# Patient Record
Sex: Male | Born: 1990 | Race: Black or African American | Hispanic: No | Marital: Single | Smoking: Never smoker
Health system: Southern US, Community
[De-identification: ages and names within clinical notes are randomized; demographics above are authoritative.]

---

## 2010-09-30 ENCOUNTER — Emergency Department (HOSPITAL_COMMUNITY)
Admission: EM | Admit: 2010-09-30 | Discharge: 2010-10-01 | Discharge status: Against Medical Advice | Payer: Self-pay | Attending: Emergency Medicine | Admitting: Emergency Medicine

## 2010-09-30 DIAGNOSIS — H5710 Ocular pain, unspecified eye: Secondary | ICD-10-CM | POA: Insufficient documentation

## 2011-04-18 ENCOUNTER — Encounter (HOSPITAL_COMMUNITY): Payer: Self-pay

## 2011-04-18 ENCOUNTER — Emergency Department (HOSPITAL_COMMUNITY)
Admission: EM | Admit: 2011-04-18 | Discharge: 2011-04-19 | Disposition: A | Discharge status: Home / Self Care | Payer: Self-pay | Attending: Emergency Medicine | Admitting: Emergency Medicine

## 2011-04-18 DIAGNOSIS — R944 Abnormal results of kidney function studies: Secondary | ICD-10-CM | POA: Insufficient documentation

## 2011-04-18 DIAGNOSIS — R7989 Other specified abnormal findings of blood chemistry: Secondary | ICD-10-CM

## 2011-04-18 DIAGNOSIS — K0889 Other specified disorders of teeth and supporting structures: Secondary | ICD-10-CM

## 2011-04-18 DIAGNOSIS — K089 Disorder of teeth and supporting structures, unspecified: Secondary | ICD-10-CM | POA: Insufficient documentation

## 2011-04-18 NOTE — ED Notes (Signed)
Pt complains of a toothache and states that he took 5 ibuprofens 200mg  at a time every hour between 6-8 while at work

## 2011-04-19 LAB — POCT I-STAT, CHEM 8
Calcium, Ion: 1.2 mmol/L (ref 1.12–1.32)
HCT: 41 % (ref 39.0–52.0)
Sodium: 141 mEq/L (ref 135–145)
TCO2: 25 mmol/L (ref 0–100)

## 2011-04-19 MED ORDER — HYDROCODONE-ACETAMINOPHEN 5-500 MG PO TABS: 1.0000 | ORAL_TABLET | Freq: Four times a day (QID) | ORAL | Status: AC | PRN

## 2011-04-19 MED ORDER — OXYCODONE-ACETAMINOPHEN 5-325 MG PO TABS
2.0000 | ORAL_TABLET | Freq: Once | ORAL | Status: AC
  Administered 2011-04-19: 2 via ORAL
  Filled 2011-04-19: qty 2

## 2011-04-19 NOTE — Discharge Instructions (Signed)
STOP any ibuprofen, motrin or any other NSAID use for the next month. Follow up with [facility] in the next week for recheck of creatinine. Follow up with dentist tomorrow is VERY important for further evaluation and management of dental pain.   Dental Pain A tooth ache may be caused by cavities (tooth decay). Cavities expose the nerve of the tooth to air and hot or cold temperatures. It may come from an infection or abscess (also called a boil or furuncle) around your tooth. It is also often caused by dental caries (tooth decay). This causes the pain you are having. DIAGNOSIS  Your caregiver can diagnose this problem by exam. TREATMENT   If caused by an infection, it may be treated with medications which kill germs (antibiotics) and pain medications as prescribed by your caregiver. Take medications as directed.   Only take over-the-counter or prescription medicines for pain, discomfort, or fever as directed by your caregiver.   Whether the tooth ache today is caused by infection or dental disease, you should see your dentist as soon as possible for further care.  SEEK MEDICAL CARE IF: The exam and treatment you received today has been provided on an emergency basis only. This is not a substitute for complete medical or dental care. If your problem worsens or new problems (symptoms) appear, and you are unable to meet with your dentist, call or return to this location. SEEK IMMEDIATE MEDICAL CARE IF:   You have a fever.   You develop redness and swelling of your face, jaw, or neck.   You are unable to open your mouth.   You have severe pain uncontrolled by pain medicine.  MAKE SURE YOU:   Understand these instructions.   Will watch your condition.   Will get help right away if you are not doing well or get worse.  Document Released: 12/27/2004 Document Revised: 12/16/2010 Document Reviewed: 08/15/2007 [facility] Patient Information 2012 [place], [place].

## 2011-04-19 NOTE — ED Notes (Signed)
Pt refusing to have his labs drawn at this time. Bethany PA aware

## 2011-04-19 NOTE — ED Provider Notes (Signed)
Medical screening examination/treatment/procedure(s) were performed by non-physician practitioner and as supervising physician I was immediately available for consultation/collaboration.  Olivia Mackie, MD 04/19/11 [phone]

## 2011-04-19 NOTE — ED Provider Notes (Signed)
History     CSN: [id number]  Arrival date & time 04/18/11  2315   First MD Initiated Contact with Patient 04/18/11 2353      Chief Complaint  Patient presents with  . Dental Pain    (Consider location/radiation/quality/duration/timing/severity/associated sxs/prior treatment) HPI  Patient presents to ER with complaint of right lower dental pain and question of ibuprofen OD. Patient states that for 4 days he has had right lower dental pain that was gradual onset and intermittent, initially improving greatly after ibuprofen use but then increasing pain and need for increased doses of ibuprofen for pain control. Patient states he took 5 ibuprofen at a time to total 1 gram and did that approximately 3 days in a row. He states the he called and got an appointment with [facility] for tomorrow but that staff was concerned with the amount of ibuprofen that he reported to them that he was taking. Denies abdominal pain, n/v/d, or changes in urination. Patient states he has no known medical problems and takes no meds on regular basis. Patient states dental pain is aggravated by chewing and cold air. Denies fevers, chills, facial swelling, difficulty breathing or difficulty swallowing.   History reviewed. No pertinent past medical history.  History reviewed. No pertinent past surgical history.  History reviewed. No pertinent family history.  History  Substance Use Topics  . Smoking status: Not on file  . Smokeless tobacco: Not on file  . Alcohol Use: No      Review of Systems  All other systems reviewed and are negative.    Allergies  Review of patient's allergies indicates no known allergies.  Home Medications   Current Outpatient Rx  Name Route Sig Dispense Refill  . IBUPROFEN 200 MG PO TABS Oral Take 1,000 mg by mouth every 6 (six) hours as needed. Pain      BP 149/78  Pulse 61  Temp(Src) 98.7 F (37.1 C) (Oral)  Resp 20  SpO2 100%  Physical Exam  Vitals  reviewed. Constitutional: He is oriented to person, place, and time. He appears well-developed and well-nourished. No distress.  HENT:  Head: Normocephalic and atraumatic.  Right Ear: External ear normal.  Left Ear: External ear normal.  Nose: Nose normal.  Mouth/Throat: No oropharyngeal exudate.       Mild TTP of right lower 2nd molar without gingival swelling or fluctuance. Patent airway.   Eyes: Conjunctivae are normal.  Neck: Normal range of motion. Neck supple.  Cardiovascular: Normal rate, regular rhythm and normal heart sounds.  Exam reveals no gallop and no friction rub.   No murmur heard. Pulmonary/Chest: Effort normal and breath sounds normal. No respiratory distress. He has no wheezes. He has no rales. He exhibits no tenderness.  Abdominal: Soft. He exhibits no distension and no mass. There is no tenderness. There is no rebound and no guarding.  Lymphadenopathy:    He has no cervical adenopathy.  Neurological: He is alert and oriented to person, place, and time. He has normal reflexes.  Skin: Skin is warm and dry. No rash noted. He is not diaphoretic.  Psychiatric: He has a normal mood and affect.    ED Course  Procedures (including critical care time)  PO percocet.   Labs Reviewed - No data to display No results found.   1. Pain, dental   2. Elevated serum creatinine     Assessment and plan discussed with Dr. Norlene Campbell  MDM  Patient has dental follow up tomorrow. Discussed mildly  elevated creatinine with patient and appropriate NSAID use in the future but no NSAID use for 1-2 months with close PCP follow up in 1-2 weeks for creatinine recheck. Patient voices understanding and is agreeable to plan.         Jenness Corner, [place] 04/19/11 [phone]

## 2015-09-04 ENCOUNTER — Encounter (HOSPITAL_COMMUNITY): Payer: Self-pay | Admitting: Emergency Medicine

## 2015-09-04 ENCOUNTER — Emergency Department (HOSPITAL_COMMUNITY)
Admission: EM | Admit: 2015-09-04 | Discharge: 2015-09-04 | Disposition: A | Discharge status: Home / Self Care | Payer: Self-pay | Attending: Emergency Medicine | Admitting: Emergency Medicine

## 2015-09-04 ENCOUNTER — Emergency Department (HOSPITAL_COMMUNITY): Payer: Self-pay

## 2015-09-04 DIAGNOSIS — Y929 Unspecified place or not applicable: Secondary | ICD-10-CM | POA: Insufficient documentation

## 2015-09-04 DIAGNOSIS — S39012A Strain of muscle, fascia and tendon of lower back, initial encounter: Secondary | ICD-10-CM | POA: Insufficient documentation

## 2015-09-04 DIAGNOSIS — Y990 Civilian activity done for income or pay: Secondary | ICD-10-CM | POA: Insufficient documentation

## 2015-09-04 DIAGNOSIS — Y93F2 Activity, caregiving, lifting: Secondary | ICD-10-CM | POA: Insufficient documentation

## 2015-09-04 DIAGNOSIS — X500XXA Overexertion from strenuous movement or load, initial encounter: Secondary | ICD-10-CM | POA: Insufficient documentation

## 2015-09-04 MED ORDER — NAPROXEN 500 MG PO TABS: 500.0000 mg | ORAL_TABLET | Freq: Two times a day (BID) | ORAL | 0 refills | Status: DC

## 2015-09-04 MED ORDER — CYCLOBENZAPRINE HCL 10 MG PO TABS: 10.0000 mg | ORAL_TABLET | Freq: Two times a day (BID) | ORAL | 0 refills | Status: DC | PRN

## 2015-09-04 NOTE — ED Triage Notes (Signed)
Pt reports lower back pain that started on Friday while at work. Got worse after sleeping that night. Pain worse with sitting upright.

## 2015-09-04 NOTE — ED Provider Notes (Signed)
[facility]-EMERGENCY DEPT Provider Note   CSN: [id number] Arrival date & time: 09/04/15  4098     History   Chief Complaint Chief Complaint  Patient presents with  . Back Pain    HPI Clifford Villanueva is a 25 y.o. male.  HPI   25 year old male presents to the ED for evaluation of lower back pain. Patient states he started work at [employer] in May, approximate 4 months ago. After the first week of working there, patient admits that he was using inappropriate lifting techniques and hurt his low back. Since then he has had intermittent low back pain in which she describes a sharp sensation, radiating across his back, worsening with heavy lifting or with prolonged sitting. Despite using appropriate lifting techniques, stretching, ice and heat, his pain has gotten progressively more frequent. This morning he woke up with sharp pain to his low back. He did take a muscle relaxant from his girlfriend which provide some relief. States pain is currently minimal but he would like further evaluation. Patient otherwise denies having fever, chills, abdominal pain, dysuria, hematuria, bowel bladder incontinence, saddle anesthesia, or radicular leg pain. He is able to work. No other complaints.  History reviewed. No pertinent past medical history.  There are no active problems to display for this patient.   History reviewed. No pertinent surgical history.     Home Medications    Prior to Admission medications   Medication Sig Start Date End Date Taking? Authorizing Provider  ibuprofen (ADVIL,MOTRIN) 200 MG tablet Take 1,000 mg by mouth every 6 (six) hours as needed. Pain    Historical Provider, MD    Family History History reviewed. No pertinent family history.  Social History Social History  Substance Use Topics  . Smoking status: Not on file  . Smokeless tobacco: Not on file  . Alcohol use No     Allergies   Review of patient's allergies indicates no known allergies.   Review of  Systems Review of Systems  Constitutional: Negative for fever.  Musculoskeletal: Positive for back pain. Negative for gait problem.  Neurological: Negative for numbness.     Physical Exam Updated Vital Signs BP 136/87   Pulse 71   Temp 98.9 F (37.2 C) (Oral)   Resp 16   SpO2 100%   Physical Exam  Constitutional: He appears well-developed and well-nourished. No distress.  Healthy-appearing male, sitting in the chair in no acute discomfort.   HENT:  Head: Atraumatic.  Eyes: Conjunctivae are normal.  Neck: Normal range of motion. Neck supple.  Cardiovascular: Intact distal pulses.   Abdominal: Soft. There is no tenderness.  Musculoskeletal: He exhibits tenderness (Mild tenderness to lumbar spine at L5-S1 and lumbar paraspinal muscle on palpation with full range of motion, no crepitus or step-off, no overlying skin changes).  Neurological: He is alert.  Able to ambulate without difficulty. Patellar deep tendon reflex intact bilaterally, no foot drops  Skin: Capillary refill takes less than 2 seconds. No rash noted.  Psychiatric: He has a normal mood and affect.     ED Treatments / Results  Labs (all labs ordered are listed, but only abnormal results are displayed) Labs Reviewed - No data to display  EKG  EKG Interpretation None       Radiology Dg Lumbar Spine Complete  Result Date: 09/04/2015 CLINICAL DATA:  Low back pain for several days following heavy lifting, initial encounter EXAM: LUMBAR SPINE - COMPLETE 4+ VIEW COMPARISON:  None. FINDINGS: There is no evidence of lumbar  spine fracture. Alignment is normal. Intervertebral disc spaces are maintained. IMPRESSION: No acute abnormality noted. Electronically Signed   By: Alcide CleverMark  Lukens M.D.   On: 09/04/2015 10:12    Procedures Procedures (including critical care time)  Medications Ordered in ED Medications - No data to display   Initial Impression / Assessment and Plan / ED Course  I have reviewed the triage  vital signs and the nursing notes.  Pertinent labs & imaging results that were available during my care of the patient were reviewed by me and considered in my medical decision making (see chart for details).  Clinical Course    BP 136/87   Pulse 71   Temp 98.9 F (37.2 C) (Oral)   Resp 16   Ht 6\' 1"  (1.854 m)   Wt 101.6 kg   SpO2 100%   BMI 29.55 kg/m    Final Clinical Impressions(s) / ED Diagnoses   Final diagnoses:  None    New Prescriptions New Prescriptions   No medications on file   No red flag s/s of low back pain. Patient was counseled on back pain precautions and told to do activity as tolerated but do not lift, push, or pull heavy objects more than 10 pounds for the next week. Patient counseled to use ice or heat on back for no longer than 15 minutes every hour.   Patient prescribed muscle relaxer and counseled on proper use of muscle relaxant medication.  Patient prescribed nonnarcotic pain medicine  Patient urged to follow-up with PCP if pain does not improve with treatment and rest or if pain becomes recurrent. Urged to return with worsening severe pain, loss of bowel or bladder control, trouble walking.  The patient verbalizes understanding and agrees with the plan.     Fayrene HelperBowie Esme Durkin, PA-C 09/04/15 1032    Lavera Guiseana Duo Liu, MD 09/04/15 2016

## 2016-05-02 ENCOUNTER — Encounter (HOSPITAL_BASED_OUTPATIENT_CLINIC_OR_DEPARTMENT_OTHER): Payer: Self-pay | Admitting: Emergency Medicine

## 2016-05-02 DIAGNOSIS — R110 Nausea: Secondary | ICD-10-CM | POA: Insufficient documentation

## 2016-05-02 DIAGNOSIS — R42 Dizziness and giddiness: Secondary | ICD-10-CM | POA: Insufficient documentation

## 2016-05-02 DIAGNOSIS — Y92410 Unspecified street and highway as the place of occurrence of the external cause: Secondary | ICD-10-CM | POA: Insufficient documentation

## 2016-05-02 DIAGNOSIS — Y999 Unspecified external cause status: Secondary | ICD-10-CM | POA: Insufficient documentation

## 2016-05-02 DIAGNOSIS — Y9389 Activity, other specified: Secondary | ICD-10-CM | POA: Insufficient documentation

## 2016-05-02 DIAGNOSIS — R51 Headache: Secondary | ICD-10-CM | POA: Insufficient documentation
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
## 2016-05-03 ENCOUNTER — Emergency Department (HOSPITAL_BASED_OUTPATIENT_CLINIC_OR_DEPARTMENT_OTHER): Payer: Self-pay

## 2016-05-03 ENCOUNTER — Emergency Department (HOSPITAL_BASED_OUTPATIENT_CLINIC_OR_DEPARTMENT_OTHER)
Admission: EM | Admit: 2016-05-03 | Discharge: 2016-05-03 | Disposition: A | Discharge status: Home / Self Care | Payer: Self-pay | Attending: Emergency Medicine | Admitting: Emergency Medicine

## 2016-05-03 ENCOUNTER — Encounter (HOSPITAL_BASED_OUTPATIENT_CLINIC_OR_DEPARTMENT_OTHER): Payer: Self-pay | Admitting: Emergency Medicine

## 2016-05-03 MED ORDER — NAPROXEN 375 MG PO TABS: 375.0000 mg | ORAL_TABLET | Freq: Two times a day (BID) | ORAL | 0 refills | Status: DC

## 2016-05-03 MED ORDER — ACETAMINOPHEN 500 MG PO TABS
1000.0000 mg | ORAL_TABLET | Freq: Once | ORAL | Status: AC
  Administered 2016-05-03: 1000 mg via ORAL
  Filled 2016-05-03: qty 2

## 2016-05-03 MED ORDER — IBUPROFEN 800 MG PO TABS
800.0000 mg | ORAL_TABLET | Freq: Once | ORAL | Status: AC
  Administered 2016-05-03: 800 mg via ORAL
  Filled 2016-05-03: qty 1
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
## 2016-06-16 NOTE — ED Notes (Signed)
[employer] called, who wanted work note faxed from this encounter. This nurse called the patient and confirmed that this would be OK. Work note faxed.

## 2017-09-13 IMAGING — CR DG LUMBAR SPINE COMPLETE 4+V
5 series · 5 of 5 positions shown · non-contrast
Comparison: None.

CLINICAL DATA: Low back pain for several days following heavy
lifting, initial encounter

EXAM:
LUMBAR SPINE - COMPLETE 4+ VIEW

[t lumbar spine ap]
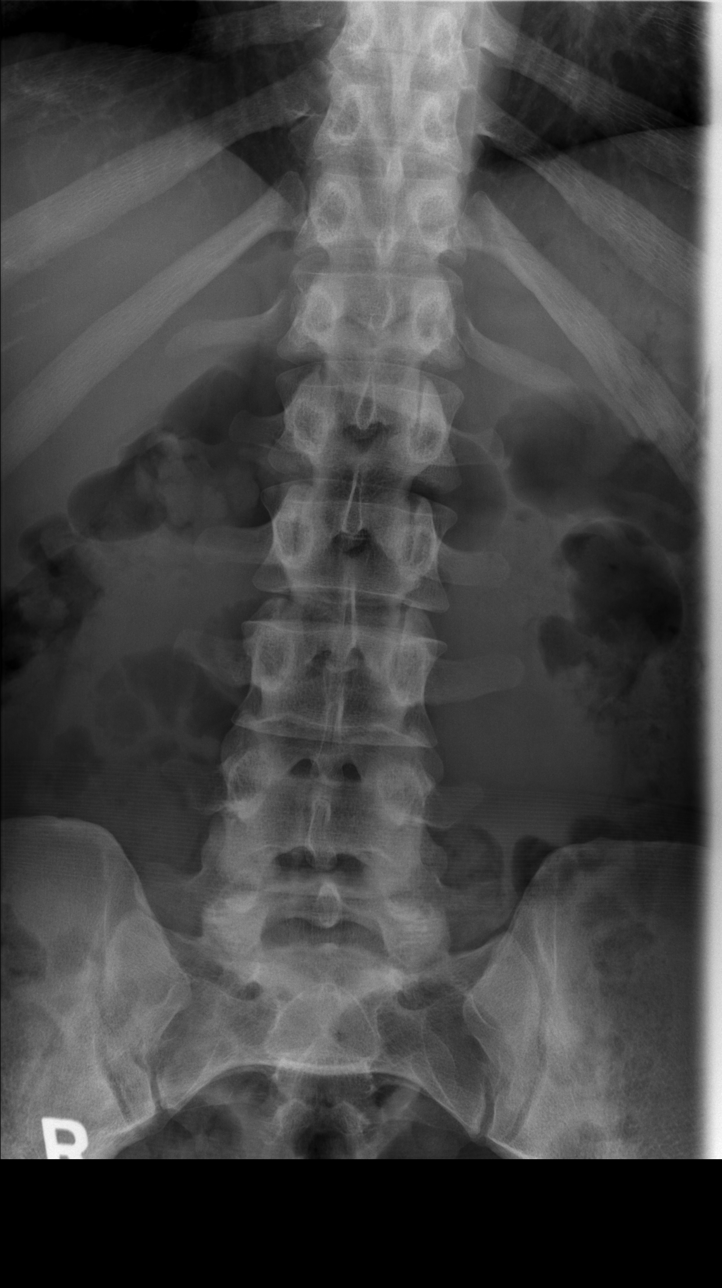

[t lumbar spine obl (1 of 2)]
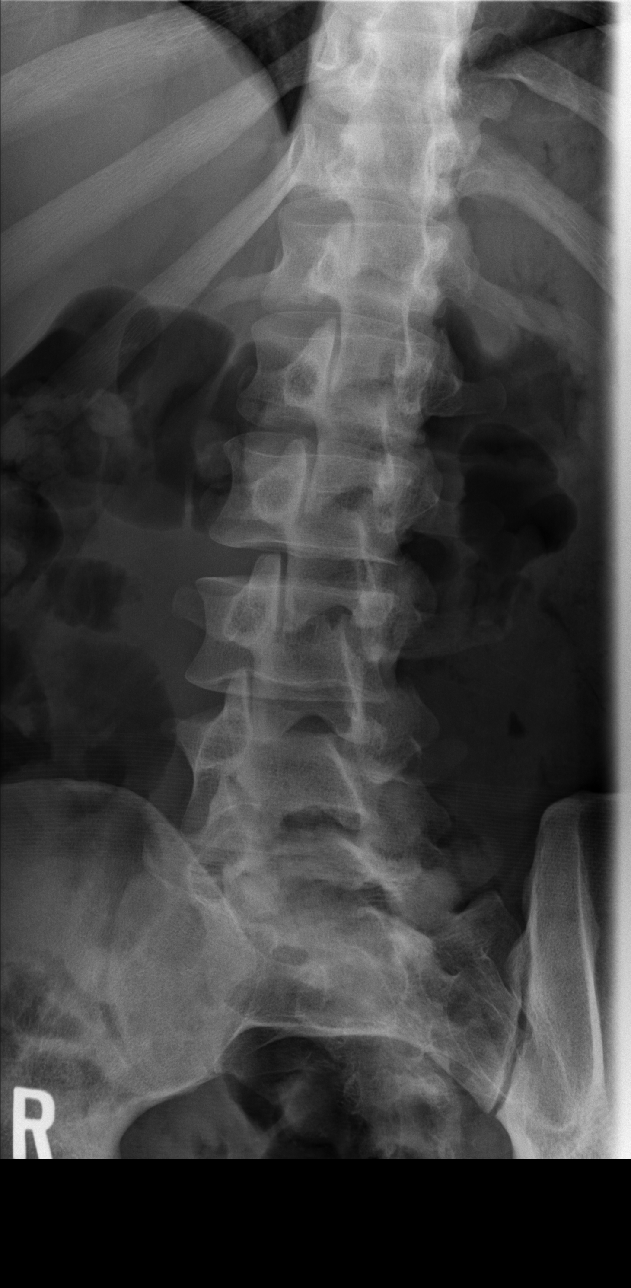

[t lumbar spine obl (2 of 2)]
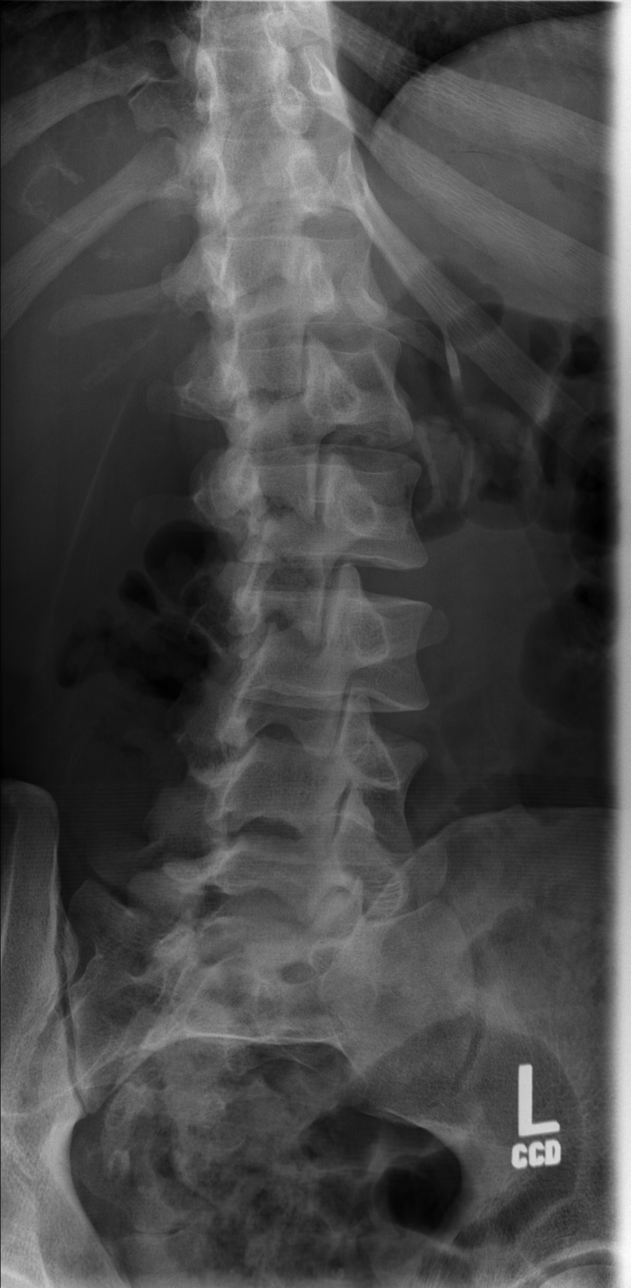

[t lumbar spine lat]
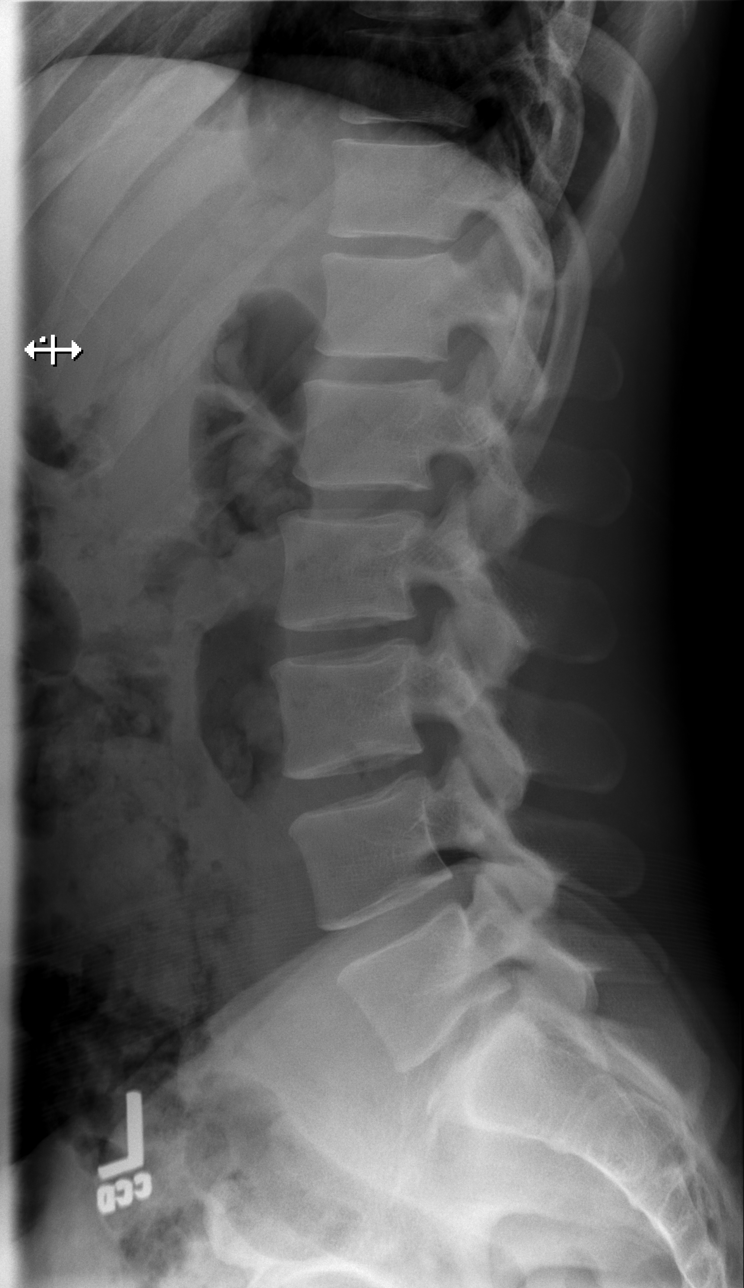

[t lumbar l-5 s-1 spot]
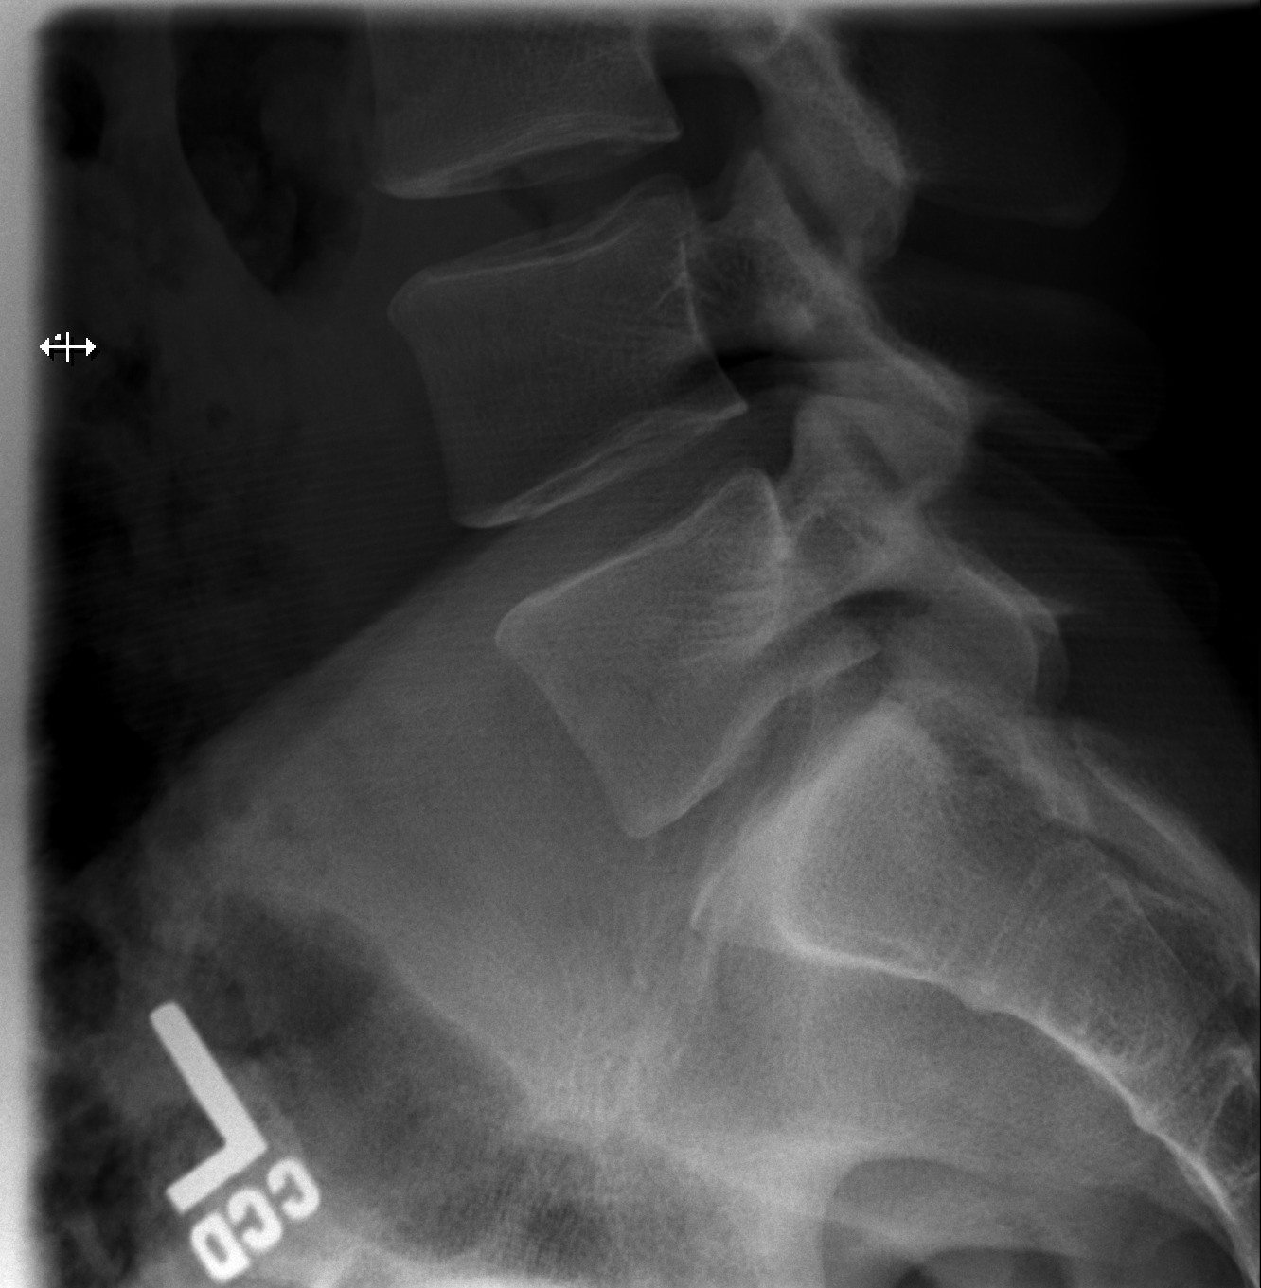

[5 of 5 positions shown; findings below may reference images not displayed]

FINDINGS: There is no evidence of lumbar spine fracture. Alignment is normal.
Intervertebral disc spaces are maintained.
IMPRESSION: No acute abnormality noted.

## 2020-11-11 ENCOUNTER — Ambulatory Visit: Payer: Self-pay | Admitting: Nurse Practitioner

## 2021-03-23 ENCOUNTER — Ambulatory Visit (INDEPENDENT_AMBULATORY_CARE_PROVIDER_SITE_OTHER): Payer: BC Managed Care – PPO | Admitting: Podiatry

## 2021-03-23 ENCOUNTER — Encounter: Payer: Self-pay | Admitting: Podiatry

## 2021-03-23 ENCOUNTER — Other Ambulatory Visit: Payer: Self-pay

## 2021-03-23 DIAGNOSIS — K6289 Other specified diseases of anus and rectum: Secondary | ICD-10-CM | POA: Insufficient documentation

## 2021-03-23 DIAGNOSIS — K5900 Constipation, unspecified: Secondary | ICD-10-CM | POA: Insufficient documentation

## 2021-03-23 DIAGNOSIS — B353 Tinea pedis: Secondary | ICD-10-CM

## 2021-03-23 DIAGNOSIS — L603 Nail dystrophy: Secondary | ICD-10-CM

## 2021-03-23 DIAGNOSIS — E669 Obesity, unspecified: Secondary | ICD-10-CM | POA: Insufficient documentation

## 2021-03-23 DIAGNOSIS — K600 Acute anal fissure: Secondary | ICD-10-CM | POA: Insufficient documentation

## 2021-03-23 NOTE — Progress Notes (Signed)
?  Subjective:  ?Patient ID: Clifford Villanueva, male    DOB: 12-09-1990,  MRN: [id number] ?HPI ?Chief Complaint  ?Patient presents with  ? Nail Problem  ?  Hallux left - thick and dark x several months, skin is peeling, tried OTC topical - no help  ? New Patient (Initial Visit)  ? ? ?31 y.o. male presents with the above complaint.  ? ?ROS: Denies fever chills nausea vomiting muscle aches pains calf pain back pain chest pain shortness of breath. ? ?No past medical history on file. ?No past surgical history on file. ? ?Current Outpatient Medications:  ?  diltiazem 2 % GEL, apply PR 3 times per day for 30 days, Disp: , Rfl:  ? ?No Known Allergies ?Review of Systems ?Objective:  ?There were no vitals filed for this visit. ? ?General: Well developed, nourished, in no acute distress, alert and oriented x3  ? ?Dermatological: Skin is warm, dry and supple bilateral. Nails x 10 are well maintained; remaining integument appears unremarkable at this time. There are no open sores, no preulcerative lesions, no rash or signs of infection present.  Some of the toenails are thick yellow dystrophic onychomycotic within her digital tinea type distribution.  Also has a moccasin type distribution of dry scaly skin the left foot over the right foot.  Hallux nails seem to be worse. ? ?Vascular: Dorsalis Pedis artery and Posterior Tibial artery pedal pulses are 2/4 bilateral with immedate capillary fill time. Pedal hair growth present. No varicosities and no lower extremity edema present bilateral.  ? ?Neruologic: Grossly intact via light touch bilateral. Vibratory intact via tuning fork bilateral. Protective threshold with Semmes Wienstein monofilament intact to all pedal sites bilateral. Patellar and Achilles deep tendon reflexes 2+ bilateral. No Babinski or clonus noted bilateral.  ? ?Musculoskeletal: No gross boney pedal deformities bilateral. No pain, crepitus, or limitation noted with foot and ankle range of motion bilateral.  Muscular strength 5/5 in all groups tested bilateral. ? ?Gait: Unassisted, Nonantalgic.  ? ? ?Radiographs: ? ?None taken ? ?Assessment & Plan:  ? ?Assessment: Tinea pedis nail dystrophy ? ?Plan: Samples of the nail and skin were taken today for pathologic evaluation we will follow-up with him in 1 month for results ? ? ? ? ?Blayde Bacigalupi T. Snyder, DPM ?

## 2021-04-20 ENCOUNTER — Ambulatory Visit: Payer: BC Managed Care – PPO | Admitting: Podiatry

## 2021-05-04 ENCOUNTER — Telehealth: Payer: Self-pay | Admitting: Podiatry

## 2021-05-04 NOTE — Telephone Encounter (Signed)
Pt would like to know if he can received his BAKO results over the phone since he does not have any insurance at this time. Would it be possible to give these results over the phone? ? ?Please advise. ?

## 2021-05-07 NOTE — Telephone Encounter (Signed)
Results of nails were positive for fungus and he will need an appt to discuss treatment.  ?

## 2021-05-07 NOTE — Telephone Encounter (Signed)
Called patient and gave results of the lab results, will need a f/u appointment to discuss treatment

## 2021-07-15 ENCOUNTER — Ambulatory Visit: Payer: Self-pay | Admitting: Podiatry

## 2021-08-19 ENCOUNTER — Ambulatory Visit (INDEPENDENT_AMBULATORY_CARE_PROVIDER_SITE_OTHER): Payer: Commercial Managed Care - PPO | Admitting: Podiatry

## 2021-08-19 DIAGNOSIS — L603 Nail dystrophy: Secondary | ICD-10-CM | POA: Diagnosis not present

## 2021-08-19 MED ORDER — TERBINAFINE HCL 250 MG PO TABS: 250.0000 mg | ORAL_TABLET | Freq: Every day | ORAL | 0 refills | Status: DC

## 2021-08-19 NOTE — Progress Notes (Signed)
He presents today states that he is here for his fungal culture results.  Objective: There is no change in physical exam.  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  Assessment: Nail dystrophy and onychomycosis.  Plan: We discussed laser therapy versus topical barrier therapy versus oral therapy.  He would like to take oral therapy if possible.  We did discuss the pros and cons of use of this medications and the possible dangers associated with it he understands this is amenable to it and will start him out with 30 tablets I will follow-up with him in 1 month and then we will ask for complete metabolic panel at that time to evaluate BUN/creatinine and liver enzymes.  Start him on Lamisil 250 mg tablets.  He will take 1 tablet by mouth daily.

## 2021-09-21 ENCOUNTER — Ambulatory Visit: Payer: Commercial Managed Care - PPO | Admitting: Podiatry

## 2021-10-05 ENCOUNTER — Ambulatory Visit: Payer: Commercial Managed Care - PPO | Admitting: Podiatry

## 2021-11-11 ENCOUNTER — Encounter: Payer: Self-pay | Admitting: Podiatry

## 2021-11-11 ENCOUNTER — Ambulatory Visit (INDEPENDENT_AMBULATORY_CARE_PROVIDER_SITE_OTHER): Payer: Commercial Managed Care - PPO | Admitting: Podiatry

## 2021-11-11 DIAGNOSIS — Z79899 Other long term (current) drug therapy: Secondary | ICD-10-CM

## 2021-11-11 DIAGNOSIS — L603 Nail dystrophy: Secondary | ICD-10-CM

## 2021-11-11 DIAGNOSIS — B353 Tinea pedis: Secondary | ICD-10-CM | POA: Diagnosis not present

## 2021-11-11 MED ORDER — TERBINAFINE HCL 250 MG PO TABS: 250.0000 mg | ORAL_TABLET | Freq: Every day | ORAL | 0 refills | Status: AC

## 2021-11-11 NOTE — Progress Notes (Signed)
He presents today with his wife and child for follow-up of his nail fungus.  He states that my skin really cleared up and I had no side effects with the medicine.  Denies fever chills nausea vomiting muscle aches pains itching or rashes.  Objective: Vital signs are stable alert and oriented x3 complete resolution of the tinea pedis bilateral but no nail change as of yet to the hallux left.  Assessment well-healing tinea pedis long-term therapy for onychomycosis.  Plan: Requesting a complete metabolic panel as well as prescribing another 90 days of Lamisil.  I will follow-up with him in 4 months

## 2022-03-21 ENCOUNTER — Ambulatory Visit: Payer: Commercial Managed Care - PPO | Admitting: Podiatry

## 2022-03-24 ENCOUNTER — Ambulatory Visit: Payer: Commercial Managed Care - PPO | Admitting: Podiatry

## 2022-05-17 ENCOUNTER — Ambulatory Visit: Payer: Self-pay | Admitting: Podiatry

## 2022-05-26 ENCOUNTER — Other Ambulatory Visit: Payer: Self-pay

## 2022-05-26 ENCOUNTER — Emergency Department (HOSPITAL_COMMUNITY)
Admission: EM | Admit: 2022-05-26 | Discharge: 2022-05-27 | Disposition: A | Discharge status: Home / Self Care | Payer: No Typology Code available for payment source | Attending: Emergency Medicine | Admitting: Emergency Medicine

## 2022-05-26 DIAGNOSIS — M25532 Pain in left wrist: Secondary | ICD-10-CM | POA: Diagnosis not present

## 2022-05-26 DIAGNOSIS — R519 Headache, unspecified: Secondary | ICD-10-CM | POA: Diagnosis not present

## 2022-05-26 DIAGNOSIS — Y92410 Unspecified street and highway as the place of occurrence of the external cause: Secondary | ICD-10-CM | POA: Diagnosis not present

## 2022-05-26 DIAGNOSIS — S62622A Displaced fracture of medial phalanx of right middle finger, initial encounter for closed fracture: Secondary | ICD-10-CM | POA: Insufficient documentation

## 2022-05-26 DIAGNOSIS — S6991XA Unspecified injury of right wrist, hand and finger(s), initial encounter: Secondary | ICD-10-CM | POA: Diagnosis present

## 2022-05-26 DIAGNOSIS — M542 Cervicalgia: Secondary | ICD-10-CM | POA: Diagnosis not present

## 2022-05-26 DIAGNOSIS — M545 Low back pain, unspecified: Secondary | ICD-10-CM | POA: Insufficient documentation

## 2022-05-26 DIAGNOSIS — H9313 Tinnitus, bilateral: Secondary | ICD-10-CM | POA: Diagnosis not present

## 2022-05-26 DIAGNOSIS — S62629A Displaced fracture of medial phalanx of unspecified finger, initial encounter for closed fracture: Secondary | ICD-10-CM
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
## 2022-05-27 ENCOUNTER — Emergency Department (HOSPITAL_COMMUNITY): Payer: No Typology Code available for payment source

## 2022-05-27 MED ORDER — IBUPROFEN 800 MG PO TABS: 800.0000 mg | ORAL_TABLET | Freq: Three times a day (TID) | ORAL | 0 refills | Status: AC

## 2022-05-27 MED ORDER — OXYCODONE-ACETAMINOPHEN 5-325 MG PO TABS
2.0000 | ORAL_TABLET | Freq: Once | ORAL | Status: AC
  Administered 2022-05-27: 2 via ORAL
  Filled 2022-05-27: qty 2

## 2022-05-27 MED ORDER — IOHEXOL 350 MG/ML SOLN: 75.0000 mL | Freq: Once | INTRAVENOUS | Status: DC | PRN

## 2022-05-27 NOTE — ED Notes (Signed)
Ortho tech notified of finger splint order 

## 2022-05-27 NOTE — Discharge Instructions (Addendum)
CT head today was normal.  You likely have a concussion which we discussed.  See attached for more information about this. You do have a small fracture in your middle finger.  Wear splint for now. Follow-up with Dr. Yehuda Budd about your finger-- call in the morning for appt. Return to the ED for new or worsening symptoms.
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
## 2022-05-27 NOTE — Progress Notes (Signed)
Orthopedic Tech Progress Note Patient Details:  Clifford Villanueva [birth year] [id number]  Ortho Devices Type of Ortho Device: Finger splint Ortho Device/Splint Location: right middle finger Ortho Device/Splint Interventions: Ordered, Application, Adjustment   Post Interventions Patient Tolerated: Well Instructions Provided: Adjustment of device  Clifford Villanueva OTR/L 05/27/2022, 5:10 AM
